# Patient Record
Sex: Male | Born: 1965 | Race: Black or African American | Hispanic: No | Marital: Single | Smoking: Former smoker
Health system: Southern US, Community
[De-identification: ages and names within clinical notes are randomized; demographics above are authoritative.]

---

## 2008-01-06 ENCOUNTER — Emergency Department (HOSPITAL_COMMUNITY): Admission: EM | Admit: 2008-01-06 | Discharge: 2008-01-06 | Payer: Self-pay | Admitting: Emergency Medicine

## 2008-01-09 ENCOUNTER — Emergency Department (HOSPITAL_COMMUNITY): Admission: EM | Admit: 2008-01-09 | Discharge: 2008-01-09 | Payer: Self-pay | Admitting: Family Medicine

## 2009-04-18 IMAGING — CR DG CHEST 2V
2 series · 2 of 2 positions shown · non-contrast
Comparison: None

CLINICAL DATA: *ABDOMINAL PAIN VOMITING;

CHEST - 2 VIEW

[w chest pa]
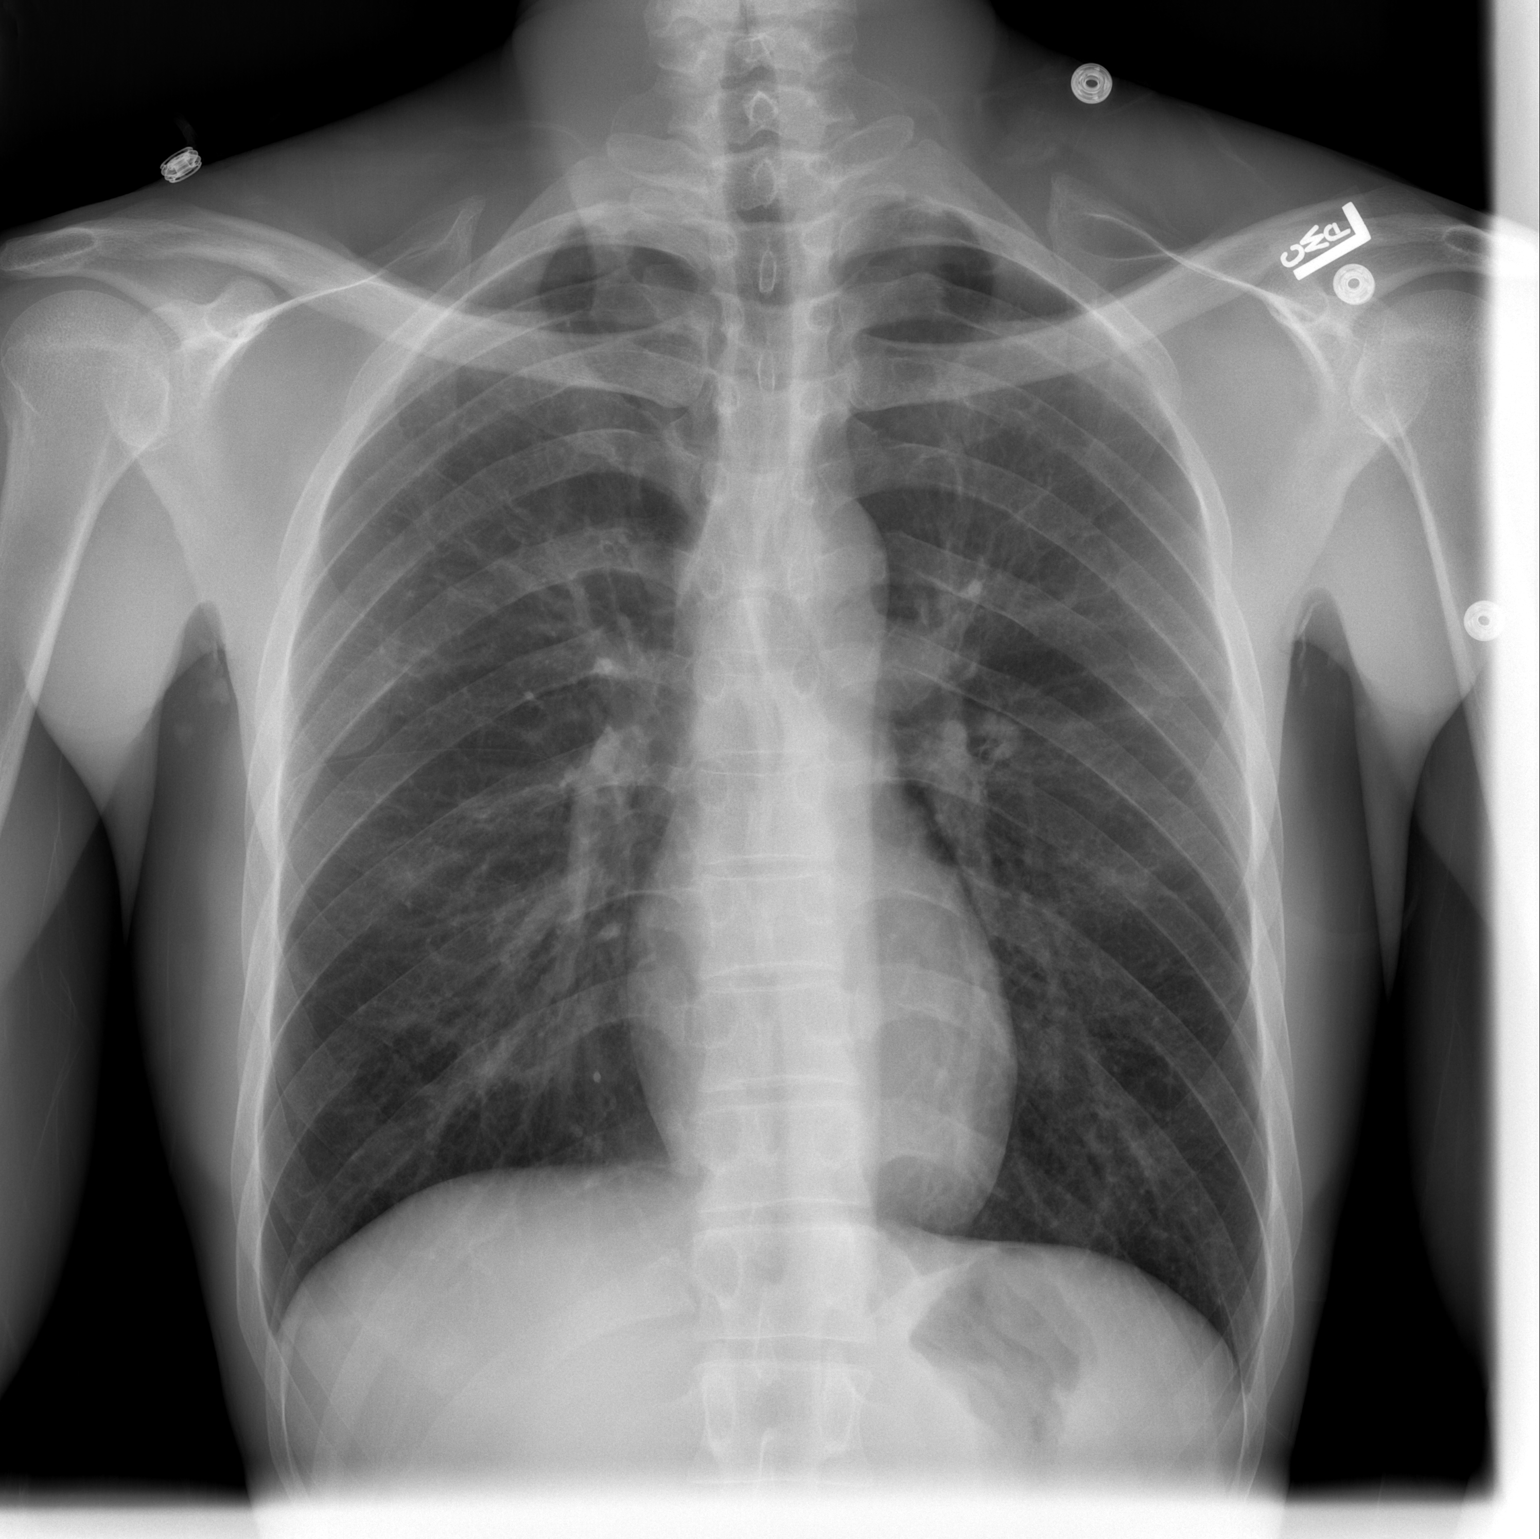

[w chest lat]
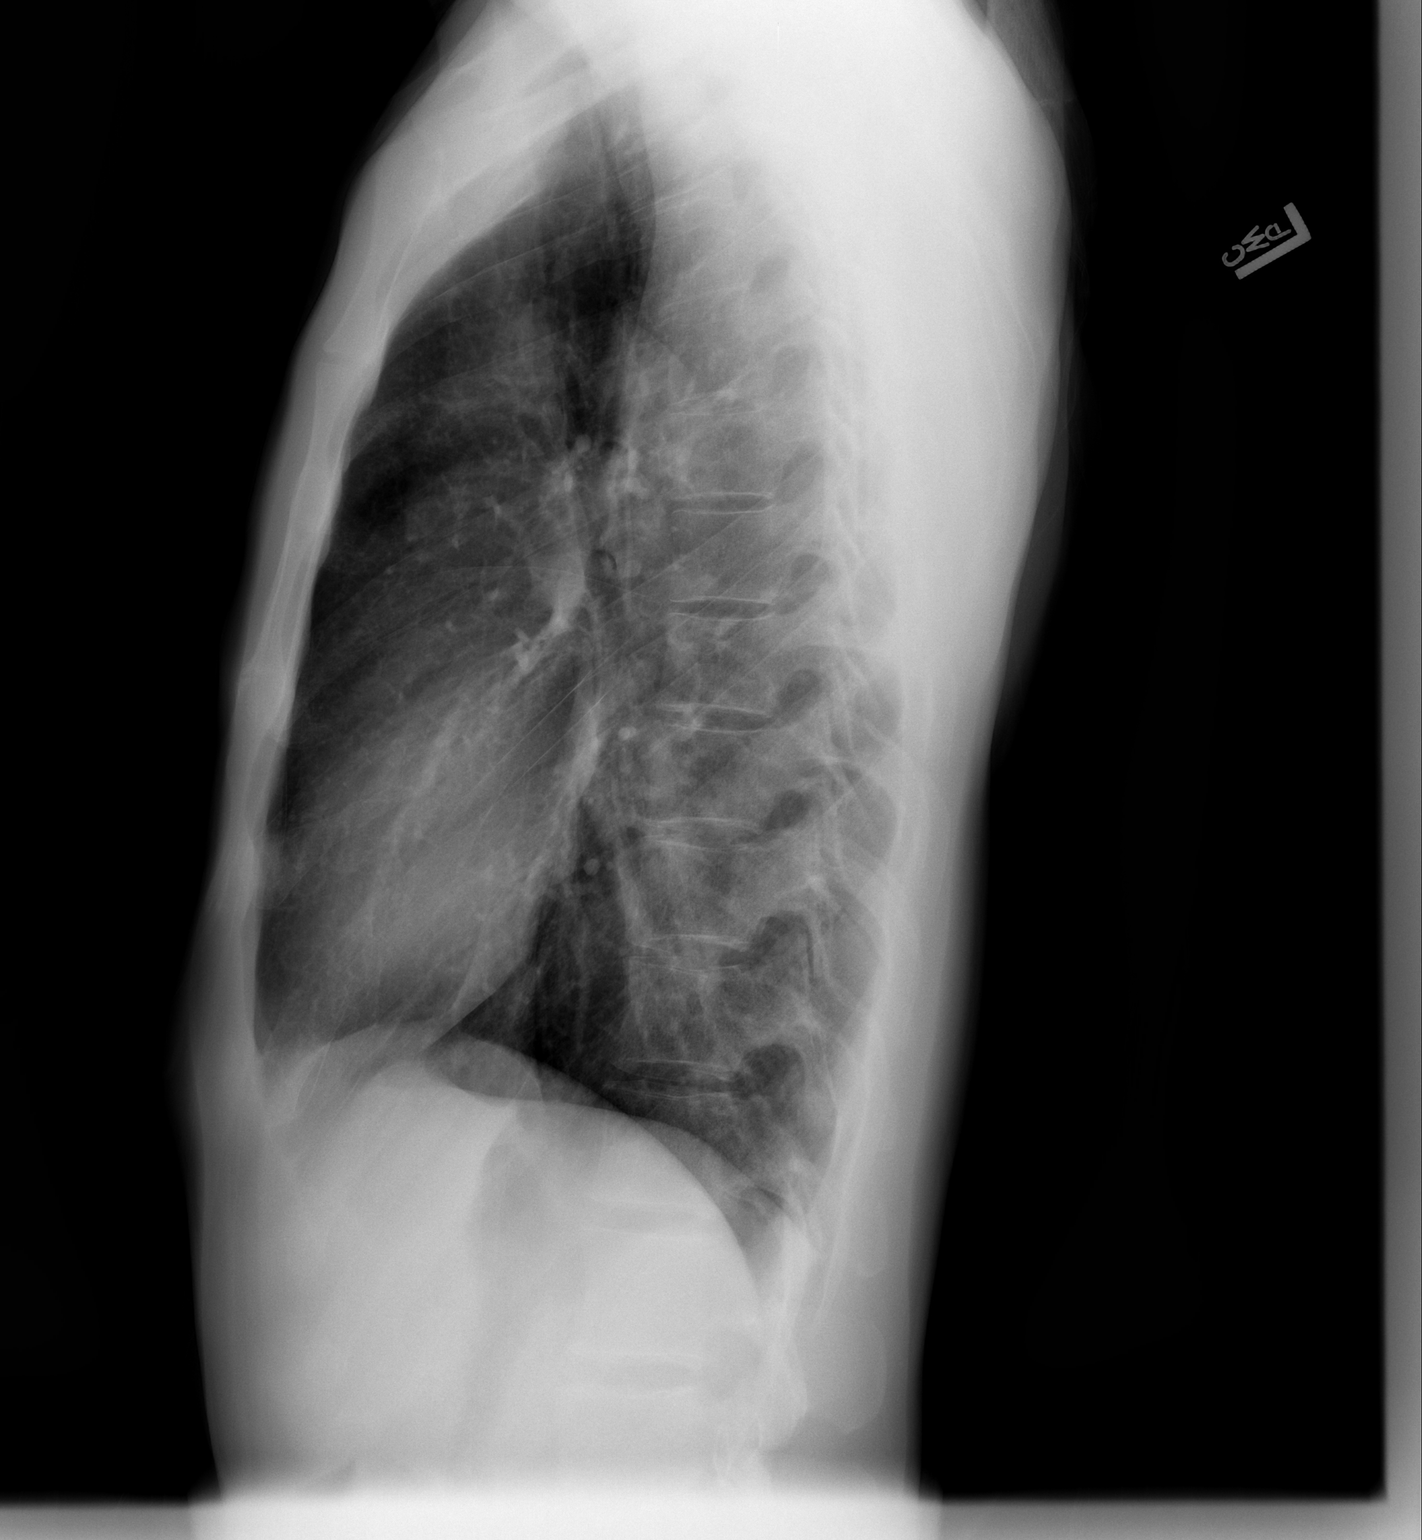

[2 of 2 positions shown; findings below may reference images not displayed]

FINDINGS: Two views of the chest demonstrate clear lungs.  The
heart and mediastinum are within normal limits.  The bony
structures are intact.  The trachea is midline. Probable nipple
shadow in the left mid chest.
IMPRESSION: No active cardiopulmonary disease.

Probable left nipple shadow.  This could be confirmed with nipple
markers on a follow-up study.

## 2011-07-09 LAB — URINALYSIS, ROUTINE W REFLEX MICROSCOPIC
Bilirubin Urine: NEGATIVE
Ketones, ur: 15 — AB
Nitrite: NEGATIVE
Urobilinogen, UA: 2 — ABNORMAL HIGH

## 2011-07-09 LAB — COMPREHENSIVE METABOLIC PANEL
ALT: 12
AST: 20
Alkaline Phosphatase: 62
CO2: 28
Chloride: 104
Creatinine, Ser: 1.22
GFR calc Af Amer: >60
GFR calc non Af Amer: >60
Sodium: 137
Total Bilirubin: 1.5 — ABNORMAL HIGH

## 2011-07-09 LAB — LIPASE, BLOOD: Lipase: 15

## 2011-07-09 LAB — DIFFERENTIAL
Basophils Absolute: 0
Basophils Relative: 0
Eosinophils Absolute: 0
Eosinophils Relative: 0

## 2011-07-09 LAB — CBC
MCV: 93.4
RBC: 4.92
WBC: 14.9 — ABNORMAL HIGH

## 2011-07-09 LAB — URINE MICROSCOPIC-ADD ON

## 2018-05-07 ENCOUNTER — Ambulatory Visit (INDEPENDENT_AMBULATORY_CARE_PROVIDER_SITE_OTHER): Payer: 59 | Admitting: Family Medicine

## 2018-05-07 ENCOUNTER — Encounter: Payer: Self-pay | Admitting: Family Medicine

## 2018-05-07 VITALS — BP 118/68 | HR 68 | Temp 98.4°F | Ht 71.0 in | Wt 120.0 lb

## 2018-05-07 DIAGNOSIS — Z7689 Persons encountering health services in other specified circumstances: Secondary | ICD-10-CM | POA: Diagnosis not present

## 2018-05-07 DIAGNOSIS — R42 Dizziness and giddiness: Secondary | ICD-10-CM

## 2018-05-07 DIAGNOSIS — H6123 Impacted cerumen, bilateral: Secondary | ICD-10-CM

## 2018-05-07 NOTE — Progress Notes (Signed)
Patient presents to clinic today to follow-up on chronic concerns and establish care.  SUBJECTIVE: PMH: Patient is a 52 year old male with no sig pmh.  Patient has not seen a physician in several years.  Patient states a few weeks ago he hit his head on a rack at work while bending down.  Patient states he developed a headache the next day.  Patient's headaches have since resolved.  Patient also mentions issues with balance/feeling dizzy.  Patient states this is been going on for "a while".  Patient states he is drinking 8-9 bottles of water per week.  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  This happened in [place].  Past surgical history: Tooth extraction  Social history: Patient has a [place] in [employer].  Patient states he has been alcohol, tobacco, drug free since May 26, 1998.  Patient states he was able to quit after "finding the Lord".  In the past pt used "all kinds of drugs" with the exception of pills.  Family history: Dad-heart disease [employer]-diabetes  Health Maintenance: Colonoscopy --never had   History reviewed. No pertinent past medical history.  History reviewed. No pertinent surgical history.  No current outpatient medications on file prior to visit.   No current facility-administered medications on file prior to visit.     Not on File  History reviewed. No pertinent family history.  Social History   Socioeconomic History  . Marital status: Single    Spouse name: Not on file  . Number of children: Not on file  . Years of education: Not on file  . Highest education level: Not on file  Occupational History  . Not on file  Social Needs  . Financial resource strain: Not on file  . Food insecurity:    Worry: Not on file    Inability: Not on file  . Transportation needs:    Medical: Not on file    Non-medical: Not on file  Tobacco Use  . Smoking status: Former Research scientist (life sciences)    . Smokeless tobacco: Former Network engineer and Sexual Activity  . Alcohol use: Not Currently    Frequency: Never  . Drug use: Not Currently  . Sexual activity: Not on file  Lifestyle  . Physical activity:    Days per week: Not on file    Minutes per session: Not on file  . Stress: Not on file  Relationships  . Social connections:    Talks on phone: Not on file    Gets together: Not on file    Attends religious service: Not on file    Active member of club or organization: Not on file    Attends meetings of clubs or organizations: Not on file    Relationship status: Not on file  . Intimate partner violence:    Fear of current or ex partner: Not on file    Emotionally abused: Not on file    Physically abused: Not on file    Forced sexual activity: Not on file  Other Topics Concern  . Not on file  Social History Narrative  . Not on file    ROS General: Denies fever, chills, night sweats, changes in weight, changes in appetite  +dizziness HEENT: Denies headaches, ear pain, changes in vision, rhinorrhea, sore throat CV: Denies CP, palpitations, SOB, orthopnea Pulm: Denies SOB, cough, wheezing GI: Denies abdominal pain, nausea, vomiting, diarrhea, constipation GU: Denies dysuria, hematuria, frequency, vaginal discharge Msk: Denies  muscle cramps, joint pains Neuro: Denies weakness, numbness, tingling Skin: Denies rashes, bruising Psych: Denies depression, anxiety, hallucinations  BP 118/68 (BP Location: Left Arm, Patient Position: Sitting, Cuff Size: Normal)   Pulse 68   Temp 98.4 F (36.9 C) (Oral)   Ht 5\' 11"  (1.803 m)   Wt 120 lb (54.4 kg)   SpO2 98%   BMI 16.74 kg/m   Physical Exam Gen. Pleasant, well developed, well-nourished, in NAD HEENT - [place]/AT, PERRL, no scleral icterus, no nasal drainage, pharynx without erythema or exudate. B/l canals occluded with cerumen.  R TM normal after irrigation.  Left canal not completely cleared as irrigation stopped 2/2 patient  feeling dizzy. Lungs: no use of accessory muscles, CTAB, no wheezes, rales or rhonchi Cardiovascular: RRR, No r/g/m, no peripheral edema Abdomen: BS present, soft, nontender, nondistended Neuro:  A&Ox3, CN II-XII intact, normal gait Skin:  Warm, dry, intact, no lesions  No results found for this or any previous visit (from the past 2160 hour(s)).  Assessment/Plan: Encounter to establish care -We reviewed the PMH, PSH, FH, SH, Meds and Allergies. -We provided refills for any medications we will prescribe as needed. -We addressed current concerns per orders and patient instructions. -We have asked for records for pertinent exams, studies, vaccines and notes from previous providers. -We have advised patient to follow up per instructions below.  Bilateral impacted cerumen -Consent obtained.  Bilateral ears irrigated.  Patient tolerated R ear, L ear partially irrigated as pt noted dizziness. -Given handout -Discussed using Debrox eardrops  Dizziness -Likely 2/2 dehydration versus cerumen impaction -Orthostatic BP readings attempted BP lying down 116/70.  Sitting up 118/68.  Unable to perform standing as patient was dizzy after ear irrigation. -Patient encouraged to increase p.o. intake of water to 4-6 bottles of water per day. -Given handout  Follow-up in the next few weeks to month  Grier Mitts, MD

## 2018-05-07 NOTE — Patient Instructions (Signed)
Dizziness Dizziness is a common problem. It makes you feel unsteady or light-headed. You may feel like you are about to pass out (faint). Dizziness can lead to getting hurt if you stumble or fall. Dizziness can be caused by many things, including:  Medicines.  Not having enough water in your body (dehydration).  Illness.  Follow these instructions at home: Eating and drinking  Drink enough fluid to keep your pee (urine) clear or pale yellow. This helps to keep you from getting dehydrated. Try to drink more clear fluids, such as water.  Do not drink alcohol.  Limit how much caffeine you drink or eat, if your doctor tells you to do that.  Limit how much salt (sodium) you drink or eat, if your doctor tells you to do that. Activity  Avoid making quick movements. ? When you stand up from sitting in a chair, steady yourself until you feel okay. ? In the morning, first sit up on the side of the bed. When you feel okay, stand slowly while you hold onto something. Do this until you know that your balance is fine.  If you need to stand in one place for a long time, move your legs often. Tighten and relax the muscles in your legs while you are standing.  Do not drive or use heavy machinery if you feel dizzy.  Avoid bending down if you feel dizzy. Place items in your home so you can reach them easily without leaning over. Lifestyle  Do not use any products that contain nicotine or tobacco, such as cigarettes and e-cigarettes. If you need help quitting, ask your doctor.  Try to lower your stress level. You can do this by using methods such as yoga or meditation. Talk with your doctor if you need help. General instructions  Watch your dizziness for any changes.  Take over-the-counter and prescription medicines only as told by your doctor. Talk with your doctor if you think that you are dizzy because of a medicine that you are taking.  Tell a friend or a family member that you are feeling  dizzy. If he or she notices any changes in your behavior, have this person call your doctor.  Keep all follow-up visits as told by your doctor. This is important. Contact a doctor if:  Your dizziness does not go away.  Your dizziness or light-headedness gets worse.  You feel sick to your stomach (nauseous).  You have trouble hearing.  You have new symptoms.  You are unsteady on your feet.  You feel like the room is spinning. Get help right away if:  You throw up (vomit) or have watery poop (diarrhea), and you cannot eat or drink anything.  You have trouble: ? Talking. ? Walking. ? Swallowing. ? Using your arms, hands, or legs.  You feel generally weak.  You are not thinking clearly, or you have trouble forming sentences. A friend or family member may notice this.  You have: ? Chest pain. ? Pain in your belly (abdomen). ? Shortness of breath. ? Sweating.  Your vision changes.  You are bleeding.  You have a very bad headache.  You have neck pain or a stiff neck.  You have a fever. These symptoms may be an emergency. Do not wait to see if the symptoms will go away. Get medical help right away. Call your local emergency services (911 in the [place] Do not drive yourself to the hospital. Summary  Dizziness makes you feel unsteady or light-headed. You may  feel like you are about to pass out (faint).  Drink enough fluid to keep your pee (urine) clear or pale yellow. Do not drink alcohol.  Avoid making quick movements if you feel dizzy.  Watch your dizziness for any changes. This information is not intended to replace advice given to you by your health care provider. Make sure you discuss any questions you have with your health care provider. Document Released: 09/20/2011 Document Revised: 10/18/2016 Document Reviewed: 10/18/2016 Elsevier Interactive Patient Education  2017 [place], Adult The ears produce a substance called earwax that helps  keep bacteria out of the ear and protects the skin in the ear canal. Occasionally, earwax can build up in the ear and cause discomfort or hearing loss. What increases the risk? This condition is more likely to develop in people who:  Are male.  Are elderly.  Naturally produce more earwax.  Clean their ears often with cotton swabs.  Use earplugs often.  Use in-ear headphones often.  Wear hearing aids.  Have narrow ear canals.  Have earwax that is overly thick or sticky.  Have eczema.  Are dehydrated.  Have excess hair in the ear canal.  What are the signs or symptoms? Symptoms of this condition include:  Reduced or muffled hearing.  A feeling of fullness in the ear or feeling that the ear is plugged.  Fluid coming from the ear.  Ear pain.  Ear itch.  Ringing in the ear.  Coughing.  An obvious piece of earwax that can be seen inside the ear canal.  How is this diagnosed? This condition may be diagnosed based on:  Your symptoms.  Your medical history.  An ear exam. During the exam, your health care provider will look into your ear with an instrument called an otoscope.  You may have tests, including a hearing test. How is this treated? This condition may be treated by:  Using ear drops to soften the earwax.  Having the earwax removed by a health care provider. The health care provider may: ? Flush the ear with water. ? Use an instrument that has a loop on the end (curette). ? Use a suction device.  Surgery to remove the wax buildup. This may be done in severe cases.  Follow these instructions at home:  Take over-the-counter and prescription medicines only as told by your health care provider.  Do not put any objects, including cotton swabs, into your ear. You can clean the opening of your ear canal with a washcloth or facial tissue.  Follow instructions from your health care provider about cleaning your ears. Do not over-clean your  ears.  Drink enough fluid to keep your urine clear or pale yellow. This will help to thin the earwax.  Keep all follow-up visits as told by your health care provider. If earwax builds up in your ears often or if you use hearing aids, consider seeing your health care provider for routine, preventive ear cleanings. Ask your health care provider how often you should schedule your cleanings.  If you have hearing aids, clean them according to instructions from the manufacturer and your health care provider. Contact a health care provider if:  You have ear pain.  You develop a fever.  You have blood, pus, or other fluid coming from your ear.  You have hearing loss.  You have ringing in your ears that does not go away.  Your symptoms do not improve with treatment.  You feel like the room  is spinning (vertigo). Summary  Earwax can build up in the ear and cause discomfort or hearing loss.  The most common symptoms of this condition include reduced or muffled hearing and a feeling of fullness in the ear or feeling that the ear is plugged.  This condition may be diagnosed based on your symptoms, your medical history, and an ear exam.  This condition may be treated by using ear drops to soften the earwax or by having the earwax removed by a health care provider.  Do not put any objects, including cotton swabs, into your ear. You can clean the opening of your ear canal with a washcloth or facial tissue. This information is not intended to replace advice given to you by your health care provider. Make sure you discuss any questions you have with your health care provider. Document Released: 11/08/2004 Document Revised: 12/12/2016 Document Reviewed: 12/12/2016 Elsevier Interactive Patient Education  [employer].

## 2018-06-26 DIAGNOSIS — Z23 Encounter for immunization: Secondary | ICD-10-CM | POA: Diagnosis not present

## 2018-10-15 DIAGNOSIS — C719 Malignant neoplasm of brain, unspecified: Secondary | ICD-10-CM

## 2018-10-15 HISTORY — DX: Malignant neoplasm of brain, unspecified: C71.9

## 2018-10-17 HISTORY — PX: CRANIOTOMY: SHX93

## 2018-11-07 ENCOUNTER — Encounter: Payer: Self-pay | Admitting: Radiation Oncology

## 2018-11-07 NOTE — Progress Notes (Signed)
error 

## 2018-11-11 ENCOUNTER — Ambulatory Visit
Admission: RE | Admit: 2018-11-11 | Discharge: 2018-11-11 | Disposition: A | Discharge status: Home / Self Care | Payer: 59 | Source: Ambulatory Visit | Attending: Radiation Oncology | Admitting: Radiation Oncology

## 2018-11-11 ENCOUNTER — Ambulatory Visit: Payer: 59

## 2018-11-12 ENCOUNTER — Ambulatory Visit
Admission: RE | Admit: 2018-11-12 | Discharge: 2018-11-12 | Disposition: A | Discharge status: Home / Self Care | Payer: Self-pay | Source: Ambulatory Visit | Attending: Radiation Oncology | Admitting: Radiation Oncology

## 2018-11-12 ENCOUNTER — Other Ambulatory Visit: Payer: Self-pay | Admitting: Radiation Oncology

## 2018-11-12 DIAGNOSIS — C719 Malignant neoplasm of brain, unspecified: Secondary | ICD-10-CM

## 2018-11-13 NOTE — Progress Notes (Signed)
Error

## 2018-11-19 ENCOUNTER — Telehealth: Payer: Self-pay | Admitting: Radiation Oncology

## 2018-11-19 NOTE — Telephone Encounter (Signed)
Spoke with patient to make sure that he wanted to cancel his radiation oncology appt for Friday, 2/7. He said that he was at a place called [facility] and would receive care there and that, indeed, he wanted to cancel his appt. Harlow Asa, RN canceled his appt with Dr. Isidore Moos and I canceled his appt for Nursing at 7:30 on 2/7.

## 2018-11-21 ENCOUNTER — Ambulatory Visit
Admission: RE | Admit: 2018-11-21 | Discharge: 2018-11-21 | Disposition: A | Discharge status: Home / Self Care | Payer: 59 | Source: Ambulatory Visit | Attending: Radiation Oncology | Admitting: Radiation Oncology

## 2018-11-21 ENCOUNTER — Ambulatory Visit: Payer: 59

## 2019-04-28 ENCOUNTER — Ambulatory Visit: Payer: Self-pay | Attending: Family Medicine

## 2019-04-28 ENCOUNTER — Other Ambulatory Visit: Payer: Self-pay

## 2019-04-28 DIAGNOSIS — R4701 Aphasia: Secondary | ICD-10-CM | POA: Insufficient documentation

## 2019-04-28 DIAGNOSIS — R41841 Cognitive communication deficit: Secondary | ICD-10-CM | POA: Insufficient documentation

## 2019-04-28 NOTE — Therapy (Signed)
[facility] [address] [place], [place], [address] Phone: [phone]   Fax:  [phone]  Speech Language Pathology Evaluation  Patient Details  Name: Danny Lewis MRN: [id number] Date of Birth: 03-02-1966 Referring MD: Allena Katz (NP, [facility]);                       Billie Ruddy., MD (PCP)   Encounter Date: 04/28/2019  End of Session - 04/28/19 1655    Visit Number  1    Number of Visits  17    Date for SLP Re-Evaluation  07/27/19   90 days   SLP Start Time  1104    SLP Stop Time   1153    SLP Time Calculation (min)  49 min    Activity Tolerance  Patient tolerated treatment well       No past medical history on file.  Past Surgical History:  Procedure Laterality Date  . 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 Ara Kussmaul [facility]    There were no vitals filed for this visit.  Subjective Assessment - 04/28/19 1114    Subjective  "I go to your restaurant and I know what I want but it comes out wrong." Mother provides examples of pt anomia and dysnomia to SLP.         SLP Evaluation [facility] - 04/28/19 1119      SLP Visit Information   SLP Received On  04/28/19    Onset Date  January 2020    Medical Diagnosis  Glioblastoma      Subjective   Patient/Family Stated Goal  Improve pt's language      General Information   HPI  Pt had brain sx in January 2020 which resulted in aphasia which resolved. Pt had chemorad for tumor that could not safely be resected., Tumor reappeared with resulting chemo beginning in June 2020 and aphasia worsened.        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    Awareness Comments  Impaired emergent awareness with clock drawing    Attention  --    Sustained Attention  --    Sustained Attention Impairment  --    Behaviors  Lability      Auditory Comprehension   Overall Auditory Comprehension  Impaired      Verbal Expression   Overall Verbal Expression  Impaired    Level of Generative/Spontaneous Verbalization  Conversation    Interfering Components  Attention    Other Verbal Expression Comments  In conversation today pt noted with dysnomia both without awareness, and with awareness. In instances where pt without awareness, pt's message was nonfunctional approx 80% of the time.      Oral Motor/Sensory Function   Overall Oral Motor/Sensory Function  Other (comment)   not completed due to clinic masking policy  SLP Education - 04/28/19 1654    Education Details  possible goal areas (attention, langauage)    Person(s) Educated  Patient;Parent(s)    Methods  Explanation    Comprehension  Verbalized understanding;Need further instruction       SLP Short Term Goals - 04/28/19 1658      SLP SHORT TERM GOAL #1   Title  pt will attend 5 minutes to a high interest topic/item during therapy, 3 sessions    Time  4    Period  Weeks    Status  New      SLP SHORT TERM GOAL #2   Title  pt will indicate awareness of verbal errors with nonverbal cue by an attempt to correct his error    Time  4    Period  Weeks    Status  New       SLP Long Term Goals - 04/28/19 1659      SLP LONG TERM GOAL #1   Title  pt will demo simple alternating attention in functional tasks in the clinic, over three sessions    Time  8    Period  Weeks   or 17 visits, for all LTGs   Status  New      SLP LONG TERM GOAL #2   Title   pt will utilize compensations for verbal expression to generate 7 minutes of functional simple to mod complex conversation over three sessions    Time  8    Period  Weeks    Status  New       Plan - 04/28/19 1655    Clinical Impression Statement  Pt presents today with dysnomia, anomia, and cognitive communication deficits in at least attention and awareness. SLP began cognitive communication testing today and it will be completed next session. Goals to be modified next 1-2 session/s.SLP believes pt will benefit from skilled ST focusing on cognitive communication and language goals to improve communication between pt and family and in the community.    Speech Therapy Frequency  2x / week    Duration  --   8  weeks or 17 visits   Treatment/Interventions  Environmental controls;Functional tasks;Compensatory techniques;SLP instruction and feedback;Multimodal communcation approach;Cueing hierarchy;Language facilitation;Cognitive reorganization;Internal/external aids;Patient/family education    Potential to Achieve Goals  Fair    Potential Considerations  Co-morbidities;Ability to learn/carryover information    Consulted and Agree with Plan of Care  Patient       Patient will benefit from skilled therapeutic intervention in order to improve the following deficits and impairments:   1. Cognitive communication deficit   2. Aphasia       Problem List There are no active problems to display for this patient.   [facility] ,[place], [place]  04/28/2019, 5:05 PM  [facility] [address] [place], [place], [address] Phone: [phone]   Fax:  [phone]  Name: Danny Lewis MRN: [id number] Date of Birth: [birth year]

## 2019-04-30 ENCOUNTER — Ambulatory Visit: Payer: Self-pay | Admitting: Speech Pathology

## 2019-04-30 ENCOUNTER — Other Ambulatory Visit: Payer: Self-pay

## 2019-04-30 DIAGNOSIS — R4701 Aphasia: Secondary | ICD-10-CM

## 2019-04-30 DIAGNOSIS — R41841 Cognitive communication deficit: Secondary | ICD-10-CM

## 2019-04-30 NOTE — Therapy (Signed)
[facility] [address] [place], [place], [address] Phone: [phone]   Fax:  [phone]  Speech Language Pathology Treatment  Patient Details  Name: Danny Lewis MRN: [id number] Date of Birth: [birth year] No data recorded  Encounter Date: 04/30/2019  End of Session - 04/30/19 1435    Visit Number  2    Number of Visits  17    Date for SLP Re-Evaluation  07/27/19   90 days   SLP Start Time  1000    SLP Stop Time   1045    SLP Time Calculation (min)  45 min    Activity Tolerance  Patient tolerated treatment well       No past medical history on file.  Past Surgical History:  Procedure Laterality Date  . 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 Ara Kussmaul [facility]    There were no vitals filed for this visit.  Subjective Assessment - 04/30/19 1357    Subjective  "I had all this taken away from me."    Currently in Pain?  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 SLP told pt we would add language goals related to his frustrations with saying prayers, articulating thoughts about scripture, and writing. SLP completed administration of CLQT (aphasia administration). Scores as follows: Non-linguistic Cognition: 38/49 (mild severity), Linguistic/aphasia: 35.5/56 (moderate severity), clock drawing: (moderate). SLP noted pt was methodical, double-checking himself in trailmaking task (corrected 1/2  errors). Selective attention impaired; pt easily distracted by noises from next therapy room. Pt with frustration with generative naming tasks (1 animal in 60 seconds, 2 correct /m/ words). For animals, pt stated, "what's that turtle everybody loves, the one with the ribs and all the stuff?" (pt was talking about a cow). He ultimately drew an image and then able to find word with semantic and first-letter cuing from SLP.       Assessment / Recommendations / Plan   Plan  Goals updated      Progression Toward Goals   Progression toward goals  Progressing toward goals       SLP Education - 04/30/19 1434    Education Details  will update/add language goals    Person(s) Educated  Patient    Methods  Explanation    Comprehension  Verbalized understanding       SLP Short Term Goals - 04/30/19 1447      SLP SHORT TERM GOAL #1   Title  pt will attend 5 minutes to a high interest topic/item during therapy, 3 sessions    Time  4    Period  Weeks    Status  New      SLP SHORT TERM GOAL #2   Title  pt will indicate awareness of verbal errors with nonverbal cue by an attempt to correct his error    Time  4  Period  Weeks    Status  New      SLP SHORT TERM GOAL #3   Title  Pt will say a short prayer x3 sessions, using compensations for aphasia/anomia.    Time  4    Period  Weeks    Status  New       SLP Long Term Goals - 04/30/19 1454      SLP LONG TERM GOAL #1   Title  pt will demo simple alternating attention in functional tasks in the clinic, over three sessions    Time  8    Period  Weeks   or 17 visits, for all LTGs   Status  New      SLP LONG TERM GOAL #2   Title  pt will utilize compensations for verbal expression to generate 7 minutes of functional simple to mod complex conversation over three sessions    Time  8    Period  Weeks    Status  New      SLP LONG TERM GOAL #3   Title  Pt will use strategies (preplanning, keywords) to deliver a scripture summary/lesson x  3 visits with occasional min A.    Time  8    Period  Weeks    Status  New       Plan - 04/30/19 1435    Clinical Impression Statement  Pt presents today with dysnomia, anomia, and cognitive communication deficits in at least attention and awareness. SLP suspects mild auditory comprehension deficits; pt requesting repetition/clarification of testing instructions.Scores consistent with mild-moderate cognitive deficits and moderate language deficits. Consider further assessment of expressive and receptive language, including reading/writing. 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 SLP believes pt will benefit from skilled ST focusing on cognitive communication and language goals to improve communication between pt and family and in the community.    Speech Therapy Frequency  2x / week    Duration  --   8  weeks or 17 visits   Treatment/Interventions  Environmental controls;Functional tasks;Compensatory techniques;SLP instruction and feedback;Multimodal communcation approach;Cueing hierarchy;Language facilitation;Cognitive reorganization;Internal/external aids;Patient/family education    Potential to Achieve Goals  Fair    Potential Considerations  Co-morbidities;Ability to learn/carryover information    Consulted and Agree with Plan of Care  Patient       Patient will benefit from skilled therapeutic intervention in order to improve the following deficits and impairments:   1. Aphasia   2. Cognitive communication deficit       Problem List There are no active problems to display for this patient.  Deneise Lever, [place], CCC-SLP Speech-Language Pathologist   Aliene Altes 04/30/2019, 2:58 PM  [facility] [address] [place], [place], [address] Phone: [phone]   Fax:  [phone]   Name: Danny Lewis MRN: [id number] Date of Birth: Mar 18, 1966

## 2019-04-30 NOTE — Patient Instructions (Signed)
Write as many as you can think of in each category  1) Books of the Bible     2) Prophets    3) Miracles    4) Fruits    5) Restaurants     Practice your writing daily. Keep a journal with you. Write down a prayer every day. Bring it with you when you come to therapy. If you have trouble, just do your best. We'll help with the rest.

## 2019-05-07 ENCOUNTER — Other Ambulatory Visit: Payer: Self-pay

## 2019-05-07 ENCOUNTER — Ambulatory Visit: Payer: Self-pay | Admitting: Speech Pathology

## 2019-05-07 DIAGNOSIS — R41841 Cognitive communication deficit: Secondary | ICD-10-CM

## 2019-05-07 DIAGNOSIS — R4701 Aphasia: Secondary | ICD-10-CM

## 2019-05-07 NOTE — Therapy (Signed)
[facility] [address] [place], [place], [address] Phone: [phone]   Fax:  [phone]  Speech Language Pathology Treatment  Patient Details  Name: Danny Lewis MRN: [id number] Date of Birth: 1966-02-15 No data recorded  Encounter Date: 05/07/2019  End of Session - 05/07/19 1329    Visit Number  3    Number of Visits  17    Date for SLP Re-Evaluation  07/27/19   90 days   SLP Start Time  1007    SLP Stop Time   1053    SLP Time Calculation (min)  46 min    Activity Tolerance  Patient tolerated treatment well       No past medical history on file.  Past Surgical History:  Procedure Laterality Date  . 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 Ara Kussmaul [facility] healthcare    There were no vitals filed for this visit.  Subjective Assessment - 05/07/19 1008    Subjective  "I'm just trying to stay positive and keep my mind straight."            ADULT SLP TREATMENT - 05/07/19 1322      General Information   Behavior/Cognition  Alert;Cooperative      Treatment Provided   Treatment provided  Cognitive-Linquistic      Pain Assessment   Pain Assessment  No/denies pain      Cognitive-Linquistic Treatment   Treatment focused on  Cognition;Aphasia    Skilled Treatment  Pt stated he did not have handout for homework from last session (SLP may have forgotten to give from printer). SLP reprinted and began working on personally relevant divergent naming tasks, for pt to continue at home. Pt wrote books of the Bible, with cues required to refrain from abbreviating (nub/numbers, gen/genesis). Pt able to correct books with shorter names (not "leviticus") with extended time and occasional written cues. Pt had difficulty with divergent naming of prophets, so SLP modified to responsive naming task. Pt required usual mod-max cues (semantic, phonemic, first-letter, written) to  generate 5 biblical prophets. Assessed pt's writing in automatic task (The Lord's prayer). Pt wrote first 4 words of the prayer to dictation, and was able to carry on with 2 additional sentences with occasional min-mod A for awareness of perseverations and written paraphasias. Pt to continue these tasks at home.       Assessment / Recommendations / Plan   Plan  Continue with current plan of care      Progression Toward Goals   Progression toward goals  Progressing toward goals       SLP Education - 05/07/19 1329    Education Details  tasks for home; consider starting a prayer journal    Person(s) Educated  Patient    Methods  Explanation;Handout    Comprehension  Verbalized understanding       SLP Short Term Goals - 05/07/19 1330      SLP SHORT TERM GOAL #1   Title  pt will attend 5 minutes to a high interest topic/item during therapy, 3 sessions    Time  4    Period  Weeks    Status  On-going      SLP SHORT TERM GOAL #2   Title  pt will indicate awareness of verbal errors with nonverbal cue by an attempt to correct his error    Time  4    Period  Weeks  Status  On-going      SLP SHORT TERM GOAL #3   Title  Pt will say a short prayer x3 sessions, using compensations for aphasia/anomia.    Time  4    Period  Weeks    Status  On-going       SLP Long Term Goals - 05/07/19 1330      SLP LONG TERM GOAL #1   Title  pt will demo simple alternating attention in functional tasks in the clinic, over three sessions    Time  8    Period  Weeks   or 17 visits, for all LTGs   Status  On-going      SLP LONG TERM GOAL #2   Title  pt will utilize compensations for verbal expression to generate 7 minutes of functional simple to mod complex conversation over three sessions    Time  8    Period  Weeks    Status  On-going      SLP LONG TERM GOAL #3   Title  Pt will use strategies (preplanning, keywords) to deliver a scripture summary/lesson x 3 visits with occasional min A.     Time  8    Period  Weeks    Status  On-going       Plan - 05/07/19 1330    Clinical Impression Statement  Pt presents today with dysnomia, anomia, and cognitive communication deficits in at least attention and awareness. SLP suspects mild auditory comprehension deficits; pt requesting repetition/clarification of testing instructions.Scores consistent with mild-moderate cognitive deficits and moderate language deficits. Consider further assessment of expressive and receptive language, including reading/writing. 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 SLP believes pt will benefit from skilled ST focusing on cognitive communication and language goals to improve communication between pt and family and in the community.    Speech Therapy Frequency  2x / week    Duration  --   8  weeks or 17 visits   Treatment/Interventions  Environmental controls;Functional tasks;Compensatory techniques;SLP instruction and feedback;Multimodal communcation approach;Cueing hierarchy;Language facilitation;Cognitive reorganization;Internal/external aids;Patient/family education    Potential to Achieve Goals  Fair    Potential Considerations  Co-morbidities;Ability to learn/carryover information    Consulted and Agree with Plan of Care  Patient       Patient will benefit from skilled therapeutic intervention in order to improve the following deficits and impairments:   1. Aphasia   2. Cognitive communication deficit       Problem List There are no active problems to display for this patient.  Deneise Lever, [place], CCC-SLP Speech-Language Pathologist  Aliene Altes 05/07/2019, 1:31 PM  [facility] [address] [place], [place], [address] Phone: [phone]   Fax:  [phone]   Name: DEVAUGHN SAVANT MRN: [id number] Date of Birth: Dec 26, 1965

## 2019-05-13 ENCOUNTER — Ambulatory Visit: Payer: Self-pay

## 2019-05-15 ENCOUNTER — Other Ambulatory Visit: Payer: Self-pay

## 2019-05-15 ENCOUNTER — Ambulatory Visit: Payer: Self-pay

## 2019-05-15 DIAGNOSIS — R4701 Aphasia: Secondary | ICD-10-CM

## 2019-05-15 DIAGNOSIS — R41841 Cognitive communication deficit: Secondary | ICD-10-CM

## 2019-05-15 NOTE — Therapy (Signed)
[facility] [address] [place], [place], [address] Phone: [phone]   Fax:  [phone]  Speech Language Pathology Treatment  Patient Details  Name: Danny Lewis MRN: [id number] Date of Birth: 26-Jun-1966 No data recorded  Encounter Date: 05/15/2019  End of Session - 05/15/19 1235    Visit Number  4    Number of Visits  17    Date for SLP Re-Evaluation  07/27/19   90 days   SLP Start Time  1104    SLP Stop Time   1145    SLP Time Calculation (min)  41 min    Activity Tolerance  Patient tolerated treatment well       No past medical history on file.  Past Surgical History:  Procedure Laterality Date  . 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 Ara Kussmaul [facility]    There were no vitals filed for this visit.  Subjective Assessment - 05/15/19 1117    Subjective  "I got everything going on right now." Pt arrives with hiccups. Ceased with 2 sneezes.    Currently in Pain?  No/denies            ADULT SLP TREATMENT - 05/15/19 1119      General Information   Behavior/Cognition  Alert;Cooperative      Treatment Provided   Treatment provided  Cognitive-Linquistic      Cognitive-Linquistic Treatment   Treatment focused on  Cognition;Aphasia    Skilled Treatment  Pt unaware of aphasic/semantic errors during session 95% of the time. SLP reviewed pt's homework with him and errors made on homework (e.g., "restaurants" - pt had chicken, fish, and "mind focus"). Simple functional divergent naming completed with mod cues consistently. Pt did not use compensations for anomia unless cued to do so and how to do so.      Assessment / Recommendations / Plan   Plan  Continue with current plan of care      Progression Toward Goals   Progression toward goals  Progressing toward goals         SLP Short Term Goals - 05/15/19 1235      SLP SHORT TERM GOAL #1   Title   pt will attend 5 minutes to a high interest topic/item during therapy, 3 sessions    Time  3    Period  Weeks    Status  On-going      SLP SHORT TERM GOAL #2   Title  pt will indicate awareness of verbal errors with nonverbal cue by an attempt to correct his error    Time  3    Period  Weeks    Status  On-going      SLP SHORT TERM GOAL #3   Title  Pt will say a short prayer x3 sessions, using compensations for aphasia/anomia.    Time  3    Period  Weeks    Status  On-going       SLP Long Term Goals - 05/15/19 1235      SLP LONG TERM GOAL #1   Title  pt will demo simple alternating attention in functional tasks in the clinic, over three sessions    Time  7    Period  Weeks   or 17 visits, for all LTGs   Status  On-going      SLP LONG TERM GOAL #2   Title  pt will utilize compensations for  verbal expression to generate 7 minutes of functional simple to mod complex conversation over three sessions    Time  7    Period  Weeks    Status  On-going      SLP LONG TERM GOAL #3   Title  Pt will use strategies (preplanning, keywords) to deliver a scripture summary/lesson x 3 visits with occasional min A.    Time  7    Period  Weeks    Status  On-going       Plan - 05/15/19 1235    Clinical Impression Statement  Pt presents today with dysnomia, anomia, and cognitive communication deficits in at least attention and awareness. SLP suspects mild auditory comprehension deficits; pt requesting repetition/clarification of testing instructions.Scores consistent with mild-moderate cognitive deficits and moderate language deficits. Consider further assessment of expressive and receptive language, including reading/writing. 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 SLP believes pt will benefit from skilled ST focusing on cognitive communication and language goals to improve communication between pt and family and in the community.     Speech Therapy Frequency  2x / week    Duration  --   8  weeks or 17 visits   Treatment/Interventions  Environmental controls;Functional tasks;Compensatory techniques;SLP instruction and feedback;Multimodal communcation approach;Cueing hierarchy;Language facilitation;Cognitive reorganization;Internal/external aids;Patient/family education    Potential to Achieve Goals  Fair    Potential Considerations  Co-morbidities;Ability to learn/carryover information    Consulted and Agree with Plan of Care  Patient       Patient will benefit from skilled therapeutic intervention in order to improve the following deficits and impairments:   1. Aphasia   2. Cognitive communication deficit       Problem List There are no active problems to display for this patient.   [facility] ,[place], [place]  05/15/2019, 12:36 PM  [facility] [address] [place], [place], [address] Phone: [phone]   Fax:  [phone]   Name: Danny Lewis MRN: [id number] Date of Birth: February 11, 1966

## 2019-05-15 NOTE — Patient Instructions (Signed)
   WRITE DOWN 4-5 FOR EACH OF THESE:  BODY PARTS      CARS -        -  -        -  -        -  -        -  -        -   BIBLE BOOKS      SPORTS -        -  -        -  -        -  -        -  -        -   DISCIPLES -  -  -  -  -

## 2019-05-19 ENCOUNTER — Other Ambulatory Visit: Payer: Self-pay

## 2019-05-19 ENCOUNTER — Ambulatory Visit: Payer: Self-pay | Attending: Family Medicine

## 2019-05-19 DIAGNOSIS — R41841 Cognitive communication deficit: Secondary | ICD-10-CM | POA: Insufficient documentation

## 2019-05-19 DIAGNOSIS — R4701 Aphasia: Secondary | ICD-10-CM | POA: Insufficient documentation

## 2019-05-19 NOTE — Patient Instructions (Signed)
It's ok to ask for help with homework, after you try it.

## 2019-05-19 NOTE — Therapy (Signed)
[facility] [address] [place], [place], [address] Phone: [phone]   Fax:  [phone]  Speech Language Pathology Treatment  Patient Details  Name: Danny Lewis MRN: [id number] Date of Birth: April 11, 1966 No data recorded  Encounter Date: 05/19/2019  End of Session - 05/19/19 1728    Visit Number  5    Number of Visits  17    Date for SLP Re-Evaluation  07/27/19    SLP Start Time  18    SLP Stop Time   1100    SLP Time Calculation (min)  40 min    Activity Tolerance  Patient tolerated treatment well       History reviewed. No pertinent past medical history.  Past Surgical History:  Procedure Laterality Date  . 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 Ara Kussmaul [facility]    There were no vitals filed for this visit.  Subjective Assessment - 05/19/19 1031    Subjective  "Just trying to get on right now."    Currently in Pain?  No/denies            ADULT SLP TREATMENT - 05/19/19 1032      General Information   Behavior/Cognition  Alert;Cooperative      Treatment Provided   Treatment provided  Cognitive-Linquistic      Cognitive-Linquistic Treatment   Treatment focused on  Cognition;Aphasia    Skilled Treatment  "I couldn't think of these disciples or - these sports." Pt req'd mod cues consistently for >2 sports, and mod-max A consistently for disciples names. Pt stated he enjoys chess so SLP had pt name chess pieces - pt named pawn and king with mod cues, and did not name others due to pt has specific names unknown to SLP for pieces, but did not use any compensatory strategies > once (queen- "it moves all over the board"), such as describing movement patterns, describing attributes ("ball on top", "the horse", etc). Question how much assistance pt is receiveing at home - told pt it was ok to ask for help after he has attempted homework. Pt demo'd  understainding.      Assessment / Recommendations / Plan   Plan  Continue with current plan of care      Progression Toward Goals   Progression toward goals  Not progressing toward goals (comment)   severity of deficit, ?carryover to home      SLP Education - 05/19/19 1728    Education Details  ok to ask for help at home with homework after pt attempts    Person(s) Educated  Patient    Methods  Explanation    Comprehension  Verbalized understanding       SLP Short Term Goals - 05/19/19 1730      SLP SHORT TERM GOAL #1   Title  pt will attend 5 minutes to a high interest topic/item during therapy, 3 sessions    Time  2    Period  Weeks    Status  On-going      SLP SHORT TERM GOAL #2   Title  pt will indicate awareness of verbal errors with nonverbal cue by an attempt to correct his error    Time  2    Period  Weeks    Status  On-going      SLP SHORT TERM GOAL #3   Title  Pt will say a short prayer x3 sessions, using compensations  for aphasia/anomia.    Time  2    Period  Weeks    Status  On-going       SLP Long Term Goals - 05/19/19 1730      SLP LONG TERM GOAL #1   Title  pt will demo simple alternating attention in functional tasks in the clinic, over three sessions    Time  6    Period  Weeks   or 17 visits, for all LTGs   Status  On-going      SLP LONG TERM GOAL #2   Title  pt will utilize compensations for verbal expression to generate 7 minutes of functional simple to mod complex conversation over three sessions    Time  6    Period  Weeks    Status  On-going      SLP LONG TERM GOAL #3   Title  Pt will use strategies (preplanning, keywords) to deliver a scripture summary/lesson x 3 visits with occasional min A.    Time  6    Period  Weeks    Status  On-going       Plan - 05/19/19 1728    Clinical Impression Statement  Pt presents today with dysnomia, anomia, and cognitive communication deficits in at least attention and awareness. Pt /writing  today functionally. 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 SLP believes pt will benefit from skilled ST focusing on cognitive communication and language goals to improve communication between pt and family and in the community.    Speech Therapy Frequency  2x / week    Duration  --   8  weeks or 17 visits   Treatment/Interventions  Environmental controls;Functional tasks;Compensatory techniques;SLP instruction and feedback;Multimodal communcation approach;Cueing hierarchy;Language facilitation;Cognitive reorganization;Internal/external aids;Patient/family education    Potential to Achieve Goals  Fair    Potential Considerations  Co-morbidities;Ability to learn/carryover information    Consulted and Agree with Plan of Care  Patient       Patient will benefit from skilled therapeutic intervention in order to improve the following deficits and impairments:   1. Aphasia   2. Cognitive communication deficit       Problem List There are no active problems to display for this patient.   [facility], [place]  05/19/2019, 5:30 PM  [facility] [address] [place], [place], [address] Phone: [phone]   Fax:  [phone]   Name: DELONTA YOHANNES MRN: [id number] Date of Birth: 04/02/1966

## 2019-05-22 ENCOUNTER — Ambulatory Visit: Payer: Self-pay

## 2019-05-25 ENCOUNTER — Ambulatory Visit: Payer: Self-pay

## 2019-05-28 ENCOUNTER — Ambulatory Visit: Payer: Self-pay

## 2019-06-02 ENCOUNTER — Other Ambulatory Visit: Payer: Self-pay

## 2019-06-02 ENCOUNTER — Ambulatory Visit: Payer: Self-pay

## 2019-06-02 DIAGNOSIS — R41841 Cognitive communication deficit: Secondary | ICD-10-CM

## 2019-06-02 DIAGNOSIS — R4701 Aphasia: Secondary | ICD-10-CM

## 2019-06-02 NOTE — Therapy (Signed)
[facility] [address] [place], [place], [address] Phone: [phone]   Fax:  [phone]  Speech Language Pathology Treatment  Patient Details  Name: Danny Lewis MRN: [id number] Date of Birth: 1965-11-14 No data recorded  Encounter Date: 06/02/2019  End of Session - 06/02/19 1137    Visit Number  6    Number of Visits  17    Date for SLP Re-Evaluation  07/27/19    SLP Start Time  1021    SLP Stop Time   1100    SLP Time Calculation (min)  39 min    Activity Tolerance  Patient tolerated treatment well       No past medical history on file.  Past Surgical History:  Procedure Laterality Date  . 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 Ara Kussmaul [facility]    There were no vitals filed for this visit.  Subjective Assessment - 06/02/19 1028    Subjective  Pt indicates he would like to be put on hold for next four weeks as he visits [facility] next week to talk about next steps with his cancer.    Currently in Pain?  No/denies            ADULT SLP TREATMENT - 06/02/19 1041      General Information   Behavior/Cognition  Alert;Cooperative      Treatment Provided   Treatment provided  Cognitive-Linquistic      Cognitive-Linquistic Treatment   Treatment focused on  Cognition;Aphasia    Skilled Treatment  13 minutes cognition: Pt reports [place] [facility] "did me [facility]." Pt relates to SLP that they "let me go cuz I didn't have any left." SLP looked at pt chart and assisted pt understanding of why care shifting to [place] req'd some assistance with memory for details for MD visit at [place] (sp tx; Individual): SLP and pt reviewed his homework. Pt could not generate the name for [facility] - used compensatory strategy of description ("the hospital over in [place]"). SLP worked iwth pt to generate [employer]" - SLP used Optician, dispensing, written cues, and  repetition for incr'd success with this term.       Assessment / Recommendations / Plan   Plan  Other (Comment)   pt would like to be placed on hold for ~4 weeks.     Progression Toward Goals   Progression toward goals  Not progressing toward goals (comment)   severity of deficit      SLP Education - 06/02/19 1136    Education Details  pt should telephone clinic when he desires to return to [place]    Person(s) Educated  Patient    Methods  Explanation    Comprehension  Verbalized understanding       SLP Short Term Goals - 06/02/19 1139      SLP SHORT TERM GOAL #1   Title  pt will attend 5 minutes to a high interest topic/item during therapy, 3 sessions    Baseline  06-02-19    Time  1    Period  Weeks    Status  On-going   on hold as of 06-04-19, all STGs     SLP SHORT TERM GOAL #2   Title  pt will indicate awareness of verbal errors with nonverbal cue by an attempt to correct his error    Time  1    Period  Weeks    Status  On-going      SLP SHORT TERM GOAL #3   Title  Pt will say a short prayer x3 sessions, using compensations for aphasia/anomia.    Time  1    Period  Weeks    Status  On-going       SLP Long Term Goals - 06/02/19 1139      SLP LONG TERM GOAL #1   Title  pt will demo simple alternating attention in functional tasks in the clinic, over three sessions    Time  5    Period  Weeks   or 17 visits, for all LTGs   Status  On-going   all LTGs on hold as of 06-02-19     SLP LONG TERM GOAL #2   Title  pt will utilize compensations for verbal expression to generate 7 minutes of functional simple to mod complex conversation over three sessions    Time  5    Period  Weeks    Status  On-going      SLP LONG TERM GOAL #3   Title  Pt will use strategies (preplanning, keywords) to deliver a scripture summary/lesson x 3 visits with occasional min A.    Time  5    Period  Weeks    Status  On-going       Plan - 06/02/19 1137    Clinical Impression Statement   Pt presents today with persistent dysnomia, anomia, and cognitive communication deficits in at least attention and awareness. Pt /writing today was functional. 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 SLP believes pt will benefit from skilled ST focusing on cognitive communication and language goals to improve communication between pt and family and in the community.Today, pt tells SLP he would like to put ST on hold for approx four weeks. He states he will phone clinic if he desires return. "I'm trying to give them a chance to -- keep me alive and I gotta - let them do it," pt stated.    Speech Therapy Frequency  2x / week    Duration  --   8  weeks or 17 visits   Treatment/Interventions  Environmental controls;Functional tasks;Compensatory techniques;SLP instruction and feedback;Multimodal communcation approach;Cueing hierarchy;Language facilitation;Cognitive reorganization;Internal/external aids;Patient/family education    Potential to Achieve Goals  Fair    Potential Considerations  Co-morbidities;Ability to learn/carryover information    Consulted and Agree with Plan of Care  Patient       Patient will benefit from skilled therapeutic intervention in order to improve the following deficits and impairments:   1. Aphasia   2. Cognitive communication deficit       Problem List There are no active problems to display for this patient.   [facility] ,[place], [place]  06/02/2019, 11:40 AM  [facility] [address] [place], [place], [address] Phone: [phone]   Fax:  [phone]   Name: Danny Lewis MRN: [id number] Date of Birth: 11/03/1965

## 2019-06-05 ENCOUNTER — Ambulatory Visit: Payer: Self-pay

## 2019-06-08 ENCOUNTER — Ambulatory Visit: Payer: Self-pay

## 2020-01-08 ENCOUNTER — Other Ambulatory Visit: Payer: Self-pay

## 2020-01-08 ENCOUNTER — Emergency Department (HOSPITAL_COMMUNITY): Payer: BLUE CROSS/BLUE SHIELD

## 2020-01-08 ENCOUNTER — Encounter (HOSPITAL_COMMUNITY): Payer: Self-pay | Admitting: Pediatrics

## 2020-01-08 ENCOUNTER — Emergency Department (HOSPITAL_COMMUNITY)
Admission: EM | Admit: 2020-01-08 | Discharge: 2020-01-08 | Disposition: A | Discharge status: Home / Self Care | Payer: BLUE CROSS/BLUE SHIELD | Attending: Emergency Medicine | Admitting: Emergency Medicine

## 2020-01-08 DIAGNOSIS — H538 Other visual disturbances: Secondary | ICD-10-CM | POA: Insufficient documentation

## 2020-01-08 DIAGNOSIS — C719 Malignant neoplasm of brain, unspecified: Secondary | ICD-10-CM | POA: Insufficient documentation

## 2020-01-08 DIAGNOSIS — Z79899 Other long term (current) drug therapy: Secondary | ICD-10-CM | POA: Diagnosis not present

## 2020-01-08 DIAGNOSIS — R531 Weakness: Secondary | ICD-10-CM

## 2020-01-08 DIAGNOSIS — Z87891 Personal history of nicotine dependence: Secondary | ICD-10-CM | POA: Diagnosis not present

## 2020-01-08 LAB — COMPREHENSIVE METABOLIC PANEL
ALT: 19 U/L (ref 0–44)
AST: 21 U/L (ref 15–41)
Albumin: 4 g/dL (ref 3.5–5.0)
Alkaline Phosphatase: 68 U/L (ref 38–126)
Anion gap: 13 (ref 5–15)
BUN: 11 mg/dL (ref 6–20)
CO2: 23 mmol/L (ref 22–32)
Calcium: 9.2 mg/dL (ref 8.9–10.3)
Chloride: 103 mmol/L (ref 98–111)
Creatinine, Ser: 0.98 mg/dL (ref 0.61–1.24)
GFR calc Af Amer: >60 mL/min (ref >60)
GFR calc non Af Amer: >60 mL/min (ref >60)
Glucose, Bld: 113 mg/dL — ABNORMAL HIGH (ref 70–99)
Potassium: 3.4 mmol/L — ABNORMAL LOW (ref 3.5–5.1)
Sodium: 139 mmol/L (ref 135–145)
Total Bilirubin: 1.1 mg/dL (ref 0.3–1.2)
Total Protein: 7 g/dL (ref 6.5–8.1)

## 2020-01-08 LAB — CBC WITH DIFFERENTIAL/PLATELET
Abs Immature Granulocytes: 0.04 10*3/uL (ref 0.00–0.07)
Basophils Absolute: 0 10*3/uL (ref 0.0–0.1)
Basophils Relative: 0 %
Eosinophils Absolute: 0.1 10*3/uL (ref 0.0–0.5)
Eosinophils Relative: 1 %
HCT: 47.1 % (ref 39.0–52.0)
Hemoglobin: 15.4 g/dL (ref 13.0–17.0)
Immature Granulocytes: 0 %
Lymphocytes Relative: 14 %
Lymphs Abs: 1.3 10*3/uL (ref 0.7–4.0)
MCH: 31.8 pg (ref 26.0–34.0)
MCHC: 32.7 g/dL (ref 30.0–36.0)
MCV: 97.3 fL (ref 80.0–100.0)
Monocytes Absolute: 0.4 10*3/uL (ref 0.1–1.0)
Monocytes Relative: 4 %
Neutro Abs: 7.7 10*3/uL (ref 1.7–7.7)
Neutrophils Relative %: 81 %
Platelets: 235 10*3/uL (ref 150–400)
RBC: 4.84 MIL/uL (ref 4.22–5.81)
RDW: 12.5 % (ref 11.5–15.5)
WBC: 9.6 10*3/uL (ref 4.0–10.5)
nRBC: 0 % (ref 0.0–0.2)

## 2020-01-08 MED ORDER — GADOBUTROL 1 MMOL/ML IV SOLN
5.0000 mL | Freq: Once | INTRAVENOUS | Status: AC | PRN
  Administered 2020-01-08: 5 mL via INTRAVENOUS

## 2020-01-08 NOTE — ED Notes (Signed)
Patient transported to MRI 

## 2020-01-08 NOTE — Discharge Instructions (Signed)
Follow-up with your oncologist at [facility].  Return to the emergency room or go to [facility] ER for any worsening or concerning symptoms through the weekend.

## 2020-01-08 NOTE — ED Provider Notes (Signed)
[facility] EMERGENCY DEPARTMENT Provider Note   CSN: [id number] Arrival date & time: 01/08/20  1128     History Chief Complaint  Patient presents with  . Weakness    EROL ODAM is a 54 y.o. male.  54 year old male brought in by mom for concern for right-sided weakness.  Patient has a history of glioma, treated at [facility], last had treatment on Monday [facility]- chemotherapy). Mom reports after treatment patient has a right foot drag/right leg weakness, fell in the kitchen that evening- heard him fall, found him sitting on the floor, patient recalls the event, states did not hit head or LOC but unsure why he fell. Reports falling out of bed the next day, no injuries. Mom states today patient continued to have right leg weakness, also right arm weakness and difficulty feeding self, notes changes in vision (unable to elaborate, reports intermittent), left side facial weakness /asymmetry, as well as speech changes.  Family called patient's heme-onc specialist and was advised to go to the ER for a CT of head.        Past Medical History:  Diagnosis Date  . 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   Past Surgical History:  Procedure Laterality Date  . 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 Ara Kussmaul [facility] healthcare       No family history on file.  Social History   Tobacco Use  . Smoking status: Former Research scientist (life sciences)  . Smokeless tobacco: Former Network engineer Use Topics  . Alcohol use: Not Currently    Comment: History of alcohol abuse- quit 1999.   . Drug use: Not Currently    Comment: history of drug abuse- quit 1999    Home Medications Prior to Admission medications   Medication Sig Start Date End Date Taking? Authorizing Provider  Levetiracetam 750 MG TB24 Take 1,500 mg by mouth every morning. 11/11/19  Yes [provider]  ondansetron (ZOFRAN) 8 MG tablet  Take 8 mg by mouth See admin instructions. Take 8mg  30 minutes prior to taking Temodar, 1 hour before radiation Monday-Friday and at bedtime on weekends, and every 8 hours as needed for nausea. 11/06/18  Yes [provider]  predniSONE (DELTASONE) 10 MG tablet Take 20 mg by mouth daily. 01/06/20  Yes [provider]  mirtazapine (REMERON) 7.5 MG tablet Take 7.5 mg by mouth at bedtime.    [provider]  sulfamethoxazole-trimethoprim (BACTRIM DS,SEPTRA DS) 800-160 MG tablet Take 1 tablet by mouth every Monday, Wednesday, and Friday. During radiation 11/06/18   [provider]  temozolomide (TEMODAR) 140 MG capsule Take 140 mg by mouth See admin instructions. Take 140mg  daily 30 minutes after Zofran, 1 hour prior to radiation therapy during the week, and at bedtime on weekends for 42 consecutive days. 11/06/18   [provider]    Allergies    Penicillins  Review of Systems   Review of Systems  Constitutional: Negative for fever.  Eyes: Positive for visual disturbance.  Respiratory: Negative for shortness of breath.   Cardiovascular: Negative for chest pain.  Gastrointestinal: Negative for nausea and vomiting.  Musculoskeletal: Positive for gait problem.  Skin: Negative for rash and wound.  Allergic/Immunologic: Positive for immunocompromised state.  Neurological: Positive for facial asymmetry, speech difficulty and weakness. Negative for dizziness, numbness and headaches.  All other systems reviewed and are negative.   Physical Exam Updated Vital  Signs BP 115/84   Pulse 71   Temp 98.1 F (36.7 C) (Oral)   Resp 11   Ht 6\' 1"  (1.854 m)   Wt 58.1 kg   SpO2 99%   BMI 16.89 kg/m   Physical Exam Vitals and nursing note reviewed.  Constitutional:      General: He is not in acute distress.    Appearance: He is well-developed. He is not diaphoretic.  HENT:     Head: Normocephalic and atraumatic.  Eyes:     General: Visual field deficit  present.     Extraocular Movements: Extraocular movements intact.     Pupils: Pupils are equal, round, and reactive to light.  Cardiovascular:     Rate and Rhythm: Normal rate and regular rhythm.     Pulses: Normal pulses.     Heart sounds: Normal heart sounds.  Pulmonary:     Effort: Pulmonary effort is normal.     Breath sounds: Normal breath sounds.  Abdominal:     Palpations: Abdomen is soft.     Tenderness: There is no abdominal tenderness.  Musculoskeletal:     Right lower leg: No edema.     Left lower leg: No edema.  Skin:    General: Skin is warm and dry.     Findings: No erythema or rash.  Neurological:     Mental Status: He is alert and oriented to person, place, and time.     Cranial Nerves: Facial asymmetry present.     Sensory: Sensation is intact. No sensory deficit.     Motor: Weakness present. No pronator drift.     Coordination: Rapid alternating movements normal.     Comments: Grip strength and leg strength weak on right compared to left.  No arm or leg drift.  Left side facial weakness.  Tongue midline. Bilateral temporal visual field loss  Psychiatric:        Behavior: Behavior normal.     ED Results / Procedures / Treatments   Labs (all labs ordered are listed, but only abnormal results are displayed) Labs Reviewed  COMPREHENSIVE METABOLIC PANEL - Abnormal; Notable for the following components:      Result Value   Potassium 3.4 (*)    Glucose, Bld 113 (*)    All other components within normal limits  CBC WITH DIFFERENTIAL/PLATELET    EKG EKG Interpretation  Date/Time:  Friday January 08 2020 11:41:21 EDT Ventricular Rate:  101 PR Interval:  138 QRS Duration: 100 QT Interval:  342 QTC Calculation: 443 R Axis:   89 Text Interpretation: Sinus tachycardia Incomplete right bundle branch block Cannot rule out Anterior infarct , age undetermined Abnormal ECG No prior ecg for comparison, No STEMI, q waves in inferior and lateral leads Confirmed by  Octaviano Glow [id number]) on 01/08/2020 12:19:37 PM   Radiology CT Head Wo Contrast  Result Date: 01/08/2020 CLINICAL DATA:  Ataxia. Right-sided weakness. Recurrent falls. Rule out stroke. History of brain tumor with resection EXAM: CT HEAD WITHOUT CONTRAST TECHNIQUE: Contiguous axial images were obtained from the base of the skull through the vertex without intravenous contrast. COMPARISON:  MRI head 10/13/2018, 10/18/2018 FINDINGS: Brain: Left femoral craniotomy and resection of tumor in the left temporal lobe. Encephalomalacia is present in the left temporal lobe. There is prominent dystrophic calcification in the left frontotemporal parietal lobe surrounding the previously noted tumor . Hypodensity in the left lower basal ganglia has progressed and may represent extension of tumor. This could be better evaluated with MRI  with contrast. No acute hemorrhage.  Ventricle size normal. Vascular: Negative for hyperdense vessel Skull: Left craniotomy.  No acute skeletal abnormality Sinuses/Orbits: Negative Other: None IMPRESSION: Postop resection of left temporal lobe tumor. Considerable dystrophic calcification is seen around the margins of the previously identified tumor. Ill-defined hypodensity left medial basal ganglia measures approximately 10 x 17 mm may represent recurrent tumor or less likely infarction. Follow-up MRI brain without with contrast suggested. Electronically Signed   By: Franchot Gallo M.D.   On: 01/08/2020 13:06   MR Brain W and Wo Contrast  Result Date: 01/08/2020 CLINICAL DATA:  History of brain tumor. Right-sided weakness. Recurrent falls. EXAM: MRI HEAD WITHOUT AND WITH CONTRAST TECHNIQUE: Multiplanar, multiecho pulse sequences of the brain and surrounding structures were obtained without and with intravenous contrast. CONTRAST:  32mL GADAVIST GADOBUTROL 1 MMOL/ML IV SOLN COMPARISON:  CT head 01/08/2020. MRI 10/18/2018. MRI head 10/13/2018 FINDINGS: Brain: Left-sided craniotomy for tumor  resection. New area of tumor extension left inferior basal ganglia extending to the midbrain and left cerebral peduncle corresponding to the CT abnormality, measuring approximately 12 x 24 mm. This shows peripheral enhancement and central necrosis compatible with tumor. There is chronic hemorrhage within this area of tumor. Enhancing tumor left frontotemporal region adjacent to the skull has progressed in the interval. This area measures approximately 26 x 13 mm. Mild enhancement along the wall of the surgical cavity compatible with residual tumor. These areas of residual tumor show restricted diffusion as noted previously. Several of these nodular areas of enhancement show interval improvement since 10/18/2018. Scattered areas of coarse calcification are present within the periphery of the tumor as noted on CT. No contralateral tumor. No acute infarct. Ventricle size normal.  No midline shift. Vascular: Normal arterial flow voids Skull and upper cervical spine: Left-sided craniotomy Sinuses/Orbits: Negative Other: None IMPRESSION: Postop resection of left temporal tumor. There is recurrent tumor in the inferior basal ganglia extending into the midbrain accounting for the CT finding. There is also an area of tumor growth superficially in the left frontotemporal region. Residual tumor along the surgical margins shows some improvement from 1 year ago. Electronically Signed   By: Franchot Gallo M.D.   On: 01/08/2020 15:20    Procedures Procedures (including critical care time)  Medications Ordered in ED Medications  gadobutrol (GADAVIST) 1 MMOL/ML injection 5 mL (5 mLs Intravenous Contrast Given 01/08/20 1500)    ED Course  I have reviewed the triage vital signs and the nursing notes.  Pertinent labs & imaging results that were available during my care of the patient were reviewed by me and considered in my medical decision making (see chart for details).  Clinical Course as of Jan 08 1639  Fri Jan 07, 5626  [id number] 54 year old male with past medical history of glioblastoma managed currently through [facility] hematology oncology with complaint of right side weakness, left facial weakness, visual disturbance and change in speech.  Onset of symptoms on Monday after completing chemo.  Exam as documented, review of records, peripheral visual fields abnormal, may not be a new finding today.  CT head with no new mass, followed with MRI as recommended.  Discussed with Dr. Arnoldo Morale with neurosurgery, review of CT scan (MRI pending, no acute intervention needed at this time.  MRI negative for acute stroke.  Discussed results with patient and mom at bedside, patient and family would like to be discharged today to follow-up with his hematology oncology team at [facility] is  to discharge with CD of his images from today for his team for comparison.  Patient to return to ER or go to [facility] ER for any new or changing symptoms through the weekend.   [LM]    Clinical Course User Index [LM] Roque Lias   MDM Rules/Calculators/A&P                      Final Clinical Impression(s) / ED Diagnoses Final diagnoses:  Glioblastoma ([place])  Weakness    Rx / DC Orders ED Discharge Orders    None       Tacy Learn, PA-C 01/08/20 1640    Wyvonnia Dusky, MD 01/08/20 1925

## 2020-01-08 NOTE — ED Notes (Signed)
Patient transported to CT 

## 2020-01-08 NOTE — ED Triage Notes (Signed)
Mom at bedside providing information. Stated patient has hx of brain tumor; concern for right sided weakness and re-current falls since yesterday morning.

## 2020-04-14 DEATH — deceased
